# Patient Record
Sex: Male | Born: 1970 | Race: White | Hispanic: No | Smoking: Former smoker
Health system: Southern US, Community
[De-identification: ages and names within clinical notes are randomized; demographics above are authoritative.]

---

## 2000-05-29 ENCOUNTER — Emergency Department (HOSPITAL_COMMUNITY): Admission: EM | Admit: 2000-05-29 | Discharge: 2000-05-29 | Payer: Self-pay | Admitting: Emergency Medicine

## 2000-07-11 ENCOUNTER — Emergency Department (HOSPITAL_COMMUNITY): Admission: EM | Admit: 2000-07-11 | Discharge: 2000-07-11 | Payer: Self-pay | Admitting: Emergency Medicine

## 2000-12-27 ENCOUNTER — Emergency Department (HOSPITAL_COMMUNITY): Admission: EM | Admit: 2000-12-27 | Discharge: 2000-12-27 | Payer: Self-pay | Admitting: *Deleted

## 2000-12-27 ENCOUNTER — Encounter: Payer: Self-pay | Admitting: Emergency Medicine

## 2001-02-21 ENCOUNTER — Emergency Department (HOSPITAL_COMMUNITY): Admission: EM | Admit: 2001-02-21 | Discharge: 2001-02-21 | Payer: Self-pay | Admitting: Emergency Medicine

## 2003-11-30 ENCOUNTER — Emergency Department (HOSPITAL_COMMUNITY): Admission: EM | Admit: 2003-11-30 | Discharge: 2003-11-30 | Payer: Self-pay | Admitting: Emergency Medicine
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
## 2017-05-09 ENCOUNTER — Ambulatory Visit (HOSPITAL_COMMUNITY)
Admission: EM | Admit: 2017-05-09 | Discharge: 2017-05-09 | Disposition: A | Discharge status: Home / Self Care | Payer: Self-pay | Attending: Family Medicine | Admitting: Family Medicine

## 2017-05-09 ENCOUNTER — Encounter (HOSPITAL_COMMUNITY): Payer: Self-pay | Admitting: Emergency Medicine

## 2017-05-09 DIAGNOSIS — W57XXXA Bitten or stung by nonvenomous insect and other nonvenomous arthropods, initial encounter: Secondary | ICD-10-CM

## 2017-05-09 DIAGNOSIS — S90562A Insect bite (nonvenomous), left ankle, initial encounter: Secondary | ICD-10-CM

## 2017-05-09 DIAGNOSIS — S80862A Insect bite (nonvenomous), left lower leg, initial encounter: Secondary | ICD-10-CM

## 2017-05-09 MED ORDER — TRIAMCINOLONE ACETONIDE 0.1 % EX CREA: 1.0000 | TOPICAL_CREAM | Freq: Two times a day (BID) | CUTANEOUS | 0 refills | Status: AC

## 2017-05-09 NOTE — ED Provider Notes (Signed)
  [facility]   [id number] 05/09/17 Arrival Time: 1505  ASSESSMENT & PLAN:  1. Insect bite, initial encounter     Meds ordered this encounter  Medications  . triamcinolone cream (KENALOG) 0.1 %    Sig: Apply 1 application topically 2 (two) times daily.    Dispense:  15 g    Refill:  0   OTC Benadryl. Close observation. Signs of skin infection discussed. If needed will follow up. Reviewed expectations re: course of current medical issues. Questions answered. Outlined signs and symptoms indicating need for more acute intervention. Patient verbalized understanding. After Visit Summary given.   SUBJECTIVE:  Beather Arbourathaniel J Marolf is a 46 y.o. male who presents with complaint of possible insect bite to LLE. Slightly raised area over L medial ankle. Minimal swelling. Noticed this morning, mainly because it is itching. Afebrile. No self treatment. Ambulatory without difficulty.   ROS: As per HPI.   OBJECTIVE:  Vitals:   05/09/17 1527  BP: 120/76  Pulse: 67  Temp: 98.4 F (36.9 C)  TempSrc: Oral  SpO2: 94%     General appearance: alert; no distress Extremities: L medial ankle with small skin excoriation and surrounding erythema that blanches; very slight warmth; mild tenderness Psychological:  alert and cooperative; normal mood and affect  No Known Allergies  PMHx, SurgHx, SocialHx, Medications, and Allergies were reviewed in the Visit Navigator and updated as appropriate.      Mardella LaymanHagler, Orlandria Kissner, MD 05/09/17 [phone]

## 2017-05-09 NOTE — ED Triage Notes (Signed)
Pt reports itching to his left medial calf/ankle last night.  Today he has swelling and redness to the area.
